# Patient Record
Sex: Female | Born: 1975 | Race: Black or African American | Hispanic: No | Marital: Single | Smoking: Former smoker
Health system: Southern US, Community
[De-identification: ages and names within clinical notes are randomized; demographics above are authoritative.]

## PROBLEM LIST (undated history)

## (undated) ENCOUNTER — Inpatient Hospital Stay (HOSPITAL_COMMUNITY): Payer: Self-pay

## (undated) DIAGNOSIS — I10 Essential (primary) hypertension: Secondary | ICD-10-CM

## (undated) DIAGNOSIS — J45909 Unspecified asthma, uncomplicated: Secondary | ICD-10-CM

## (undated) HISTORY — PX: HIP SURGERY: SHX245

---

## 2010-01-15 ENCOUNTER — Emergency Department (HOSPITAL_BASED_OUTPATIENT_CLINIC_OR_DEPARTMENT_OTHER): Admission: EM | Admit: 2010-01-15 | Discharge: 2010-01-15 | Payer: Self-pay | Admitting: Emergency Medicine

## 2010-01-15 ENCOUNTER — Ambulatory Visit: Payer: Self-pay | Admitting: Diagnostic Radiology

## 2010-10-14 LAB — WET PREP, GENITAL
Trich, Wet Prep: NONE SEEN
Yeast Wet Prep HPF POC: NONE SEEN

## 2010-10-14 LAB — DIFFERENTIAL
Basophils Absolute: 0.1 10*3/uL (ref 0.0–0.1)
Basophils Relative: 2 % — ABNORMAL HIGH (ref 0–1)
Eosinophils Absolute: 0 10*3/uL (ref 0.0–0.7)
Lymphocytes Relative: 34 % (ref 12–46)

## 2010-10-14 LAB — CBC
MCHC: 33 g/dL (ref 30.0–36.0)
MCV: 90.7 fL (ref 78.0–100.0)
RBC: 4.26 MIL/uL (ref 3.87–5.11)
WBC: 8.8 10*3/uL (ref 4.0–10.5)

## 2010-10-14 LAB — URINALYSIS, ROUTINE W REFLEX MICROSCOPIC
Bilirubin Urine: NEGATIVE
Glucose, UA: NEGATIVE mg/dL
Leukocytes, UA: NEGATIVE
Protein, ur: NEGATIVE mg/dL
Urobilinogen, UA: 0.2 mg/dL (ref 0.0–1.0)
pH: 6.5 (ref 5.0–8.0)

## 2010-10-14 LAB — PREGNANCY, URINE: Preg Test, Ur: POSITIVE

## 2010-10-14 LAB — GC/CHLAMYDIA PROBE AMP, GENITAL
Chlamydia, DNA Probe: NEGATIVE
GC Probe Amp, Genital: NEGATIVE

## 2010-10-14 LAB — HCG, QUANTITATIVE, PREGNANCY: hCG, Beta Chain, Quant, S: 59 m[IU]/mL — ABNORMAL HIGH (ref <5)

## 2012-01-29 IMAGING — US US OB TRANSVAGINAL
1 series · 14 of 28 positions shown · non-contrast
Comparison: None.

CLINICAL DATA: Pelvic pain and cramping.  Vaginal bleeding.  5-week-
3-day gestational age by LMP.

OBSTETRIC <14 WK US AND TRANSVAGINAL OB US
TECHNIQUE: Both transabdominal and transvaginal ultrasound
examinations were performed for complete evaluation of the
gestation as well as the maternal uterus, adnexal regions, and
pelvic cul-de-sac.  Transvaginal technique was performed to assess
early pregnancy.

[Series 1: us ob transvaginal · 0.30mm/px · 14 of 47 slices shown]
[im 2/47]
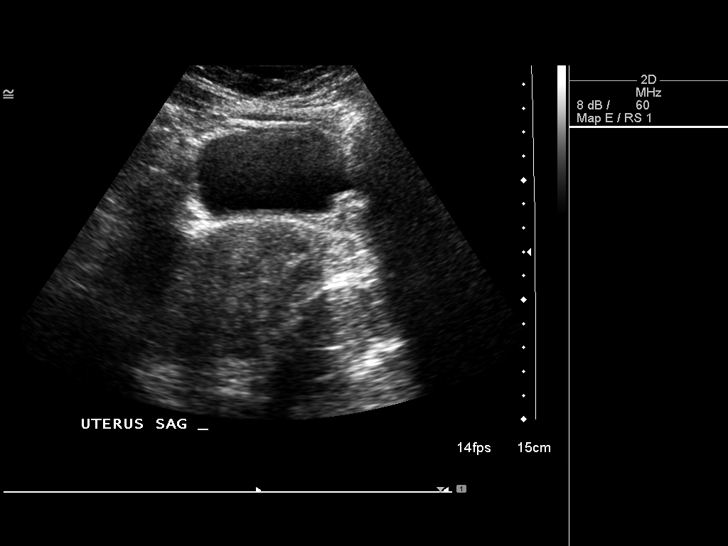
[im 6/47]
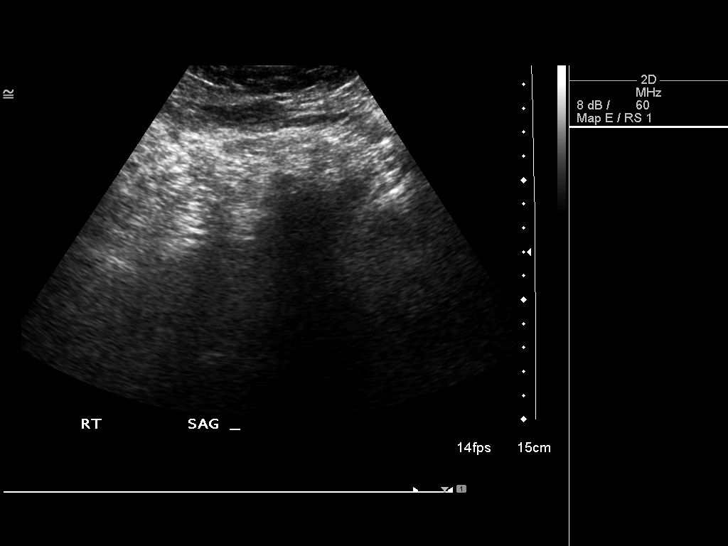
[im 9/47]
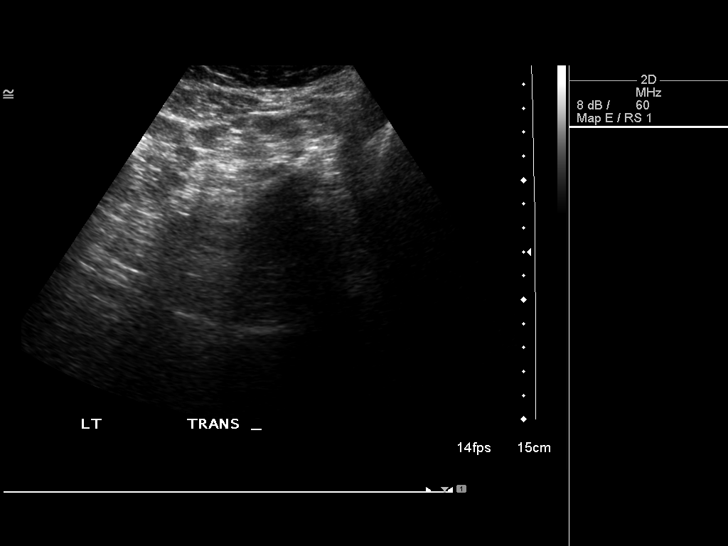
[im 12/47]
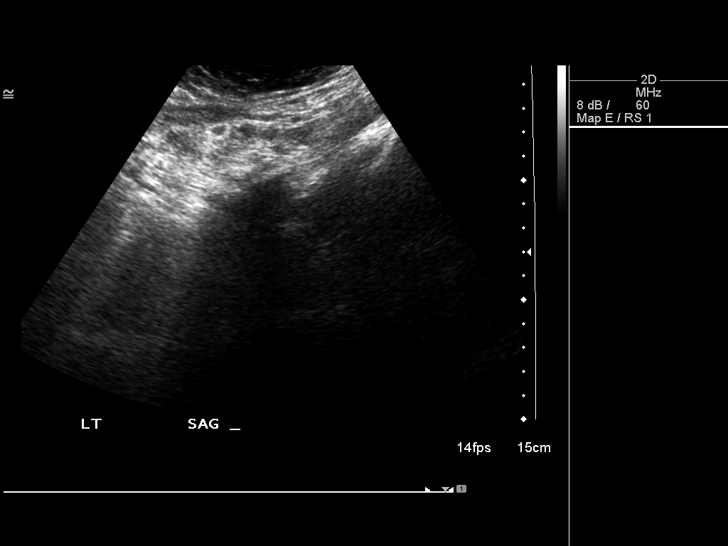
[im 16/47]
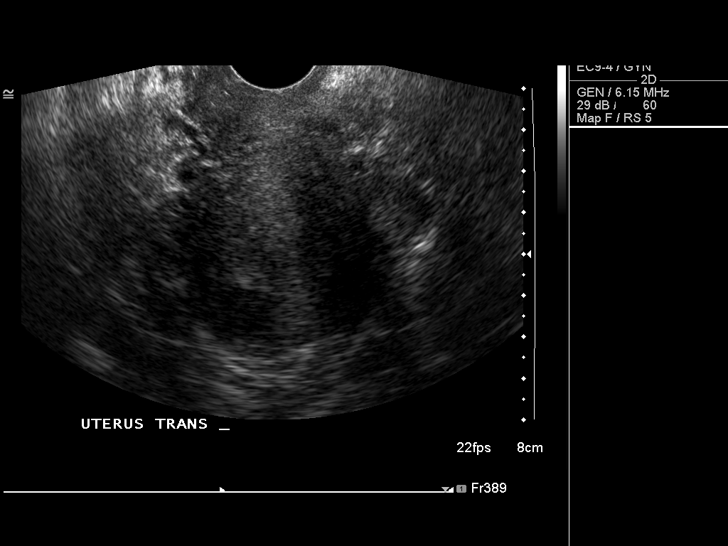
[im 19/47]
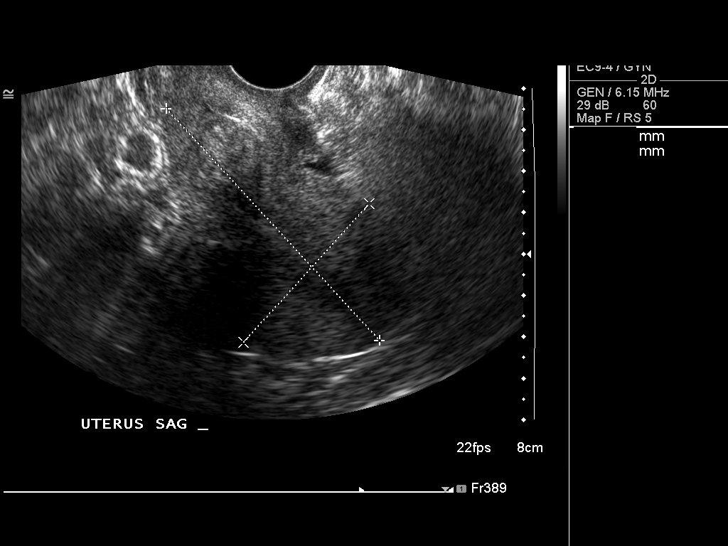
[im 23/47]
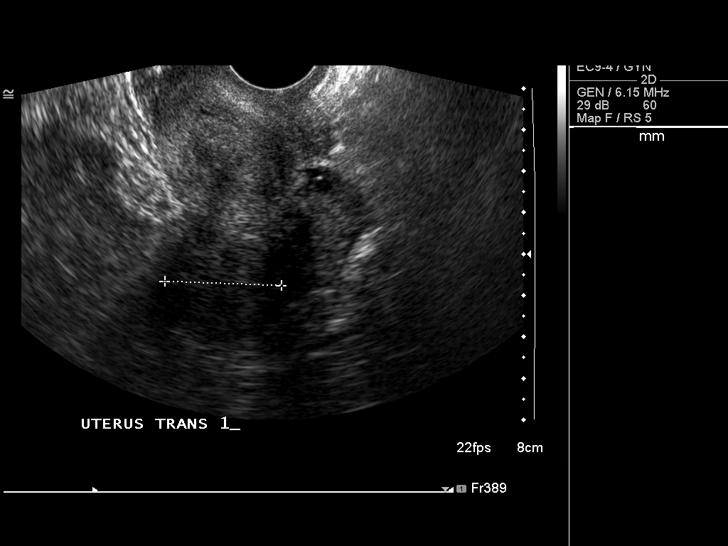
[im 26/47]
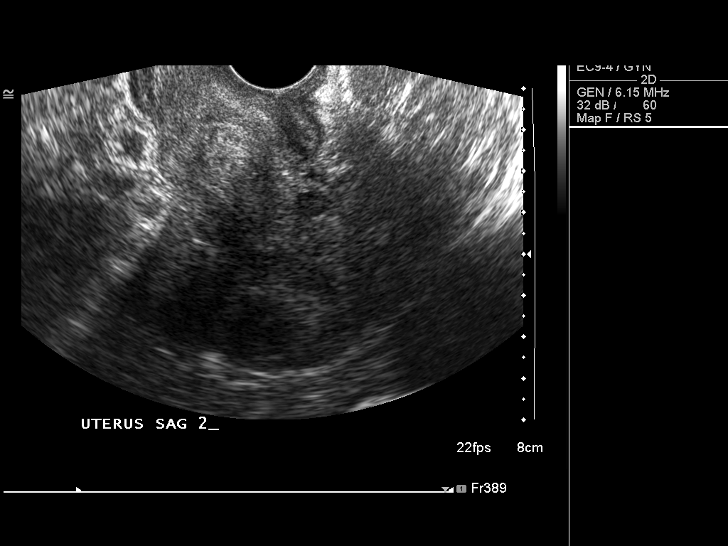
[im 29/47]
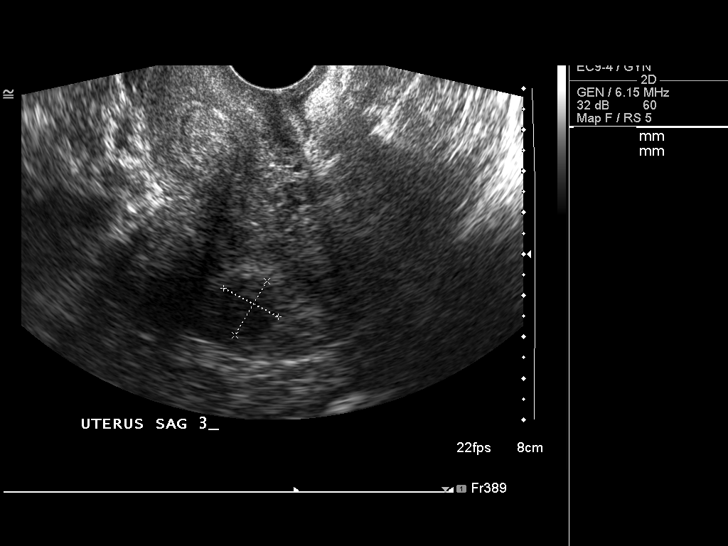
[im 33/47]
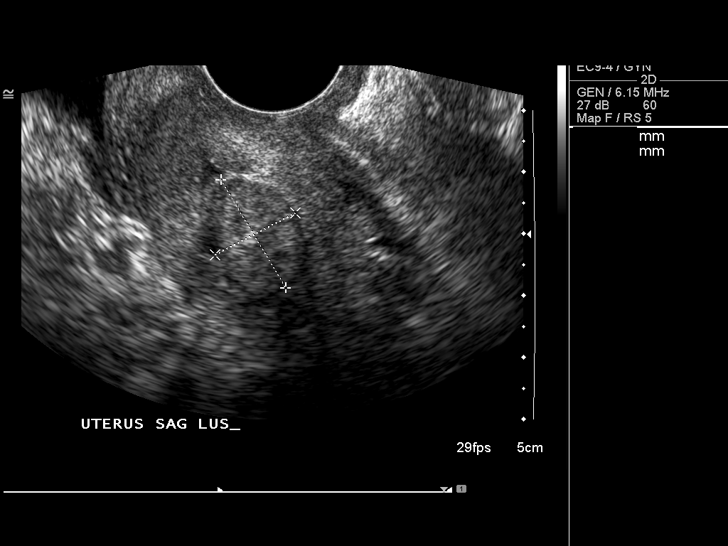
[im 36/47]
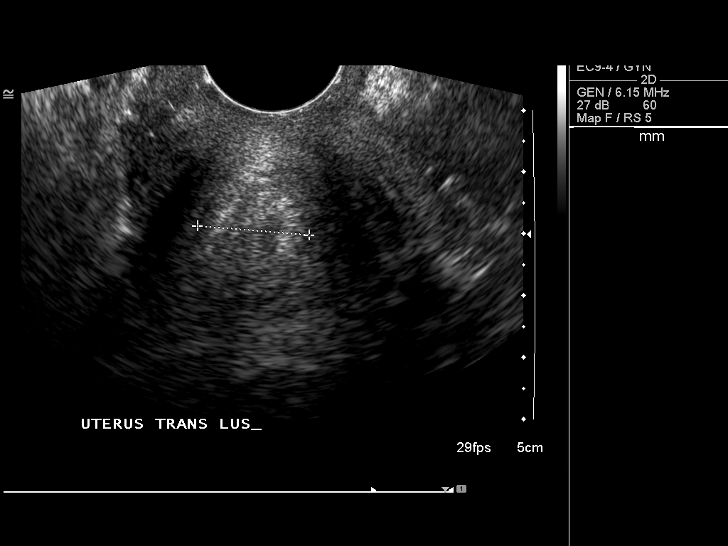
[im 40/47]
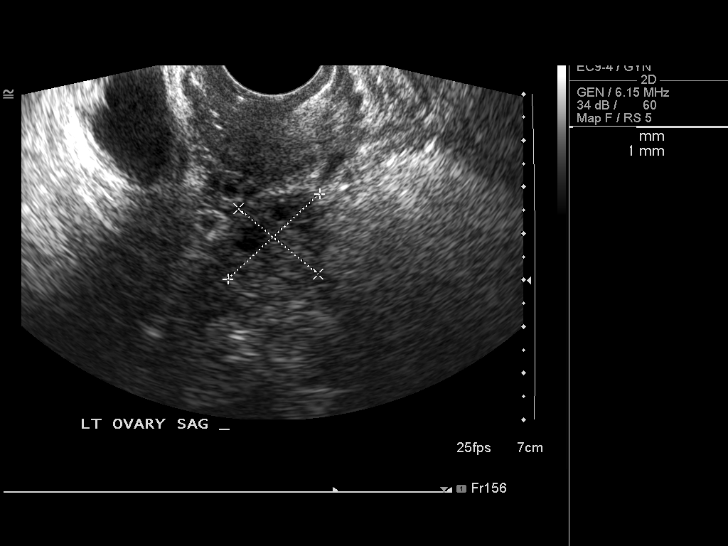
[im 43/47]
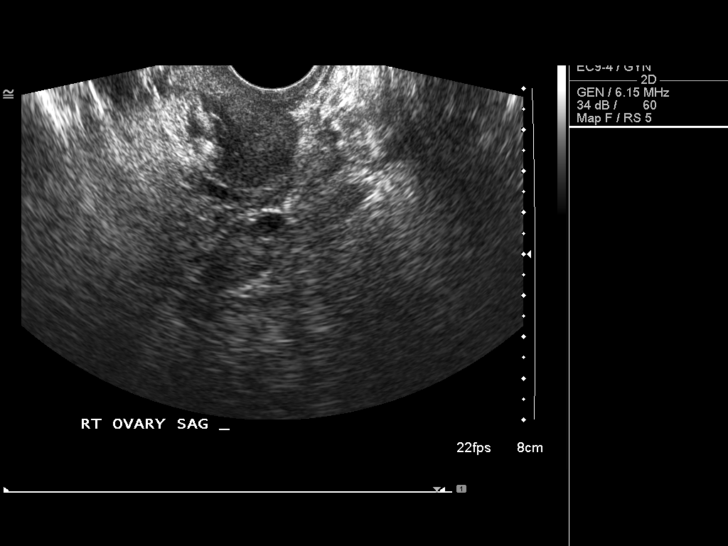
[im 47/47]
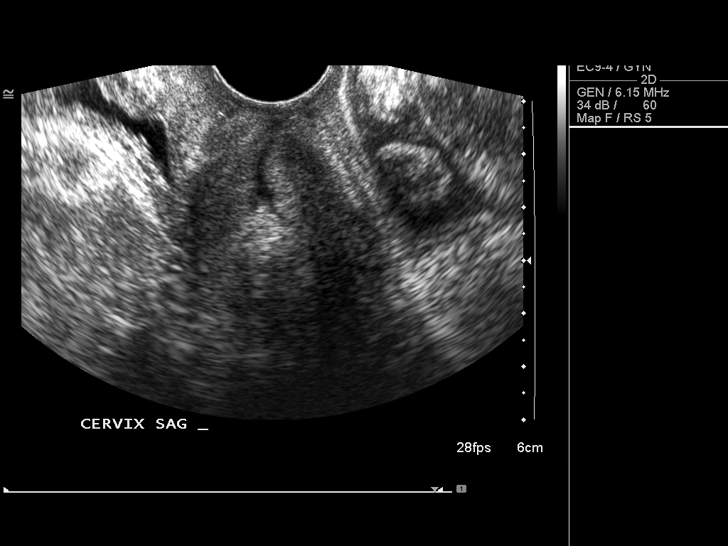

[14 of 28 positions shown; findings below may reference images not displayed]

FINDINGS: No intrauterine gestational sac or other fluid
collections are identified within the endometrial cavity.

Multiple uterine fibroids are seen which range in size from 1.1 cm
to 2.8 cm.

Both ovaries are normal in appearance.  There is no evidence of
adnexal mass or free fluid.
IMPRESSION: 1.  No evidence of intrauterine gestational sac or adnexal mass.
Differential diagnosis includes IUP too early to visualize, recent
spontaneous abortion, and occult ectopic pregnancy.  Close follow
up of quantitative beta HCG levels is recommended, with follow-up
by ultrasound if clinically warranted.
2.  Multiple uterine fibroids measuring up to 2.8 cm.

## 2013-02-11 ENCOUNTER — Inpatient Hospital Stay (HOSPITAL_COMMUNITY)
Admission: AD | Admit: 2013-02-11 | Discharge: 2013-02-11 | Disposition: A | Discharge status: Home / Self Care | Payer: BC Managed Care – PPO | Source: Ambulatory Visit | Attending: Obstetrics & Gynecology | Admitting: Obstetrics & Gynecology

## 2013-02-11 ENCOUNTER — Inpatient Hospital Stay (HOSPITAL_COMMUNITY): Payer: BC Managed Care – PPO

## 2013-02-11 ENCOUNTER — Encounter (HOSPITAL_COMMUNITY): Payer: Self-pay | Admitting: *Deleted

## 2013-02-11 DIAGNOSIS — N730 Acute parametritis and pelvic cellulitis: Secondary | ICD-10-CM | POA: Insufficient documentation

## 2013-02-11 DIAGNOSIS — O030 Genital tract and pelvic infection following incomplete spontaneous abortion: Secondary | ICD-10-CM | POA: Insufficient documentation

## 2013-02-11 DIAGNOSIS — O034 Incomplete spontaneous abortion without complication: Secondary | ICD-10-CM

## 2013-02-11 HISTORY — DX: Unspecified asthma, uncomplicated: J45.909

## 2013-02-11 HISTORY — DX: Essential (primary) hypertension: I10

## 2013-02-11 LAB — CBC
HCT: 32.8 % — ABNORMAL LOW (ref 36.0–46.0)
Hemoglobin: 10.7 g/dL — ABNORMAL LOW (ref 12.0–15.0)
MCH: 30 pg (ref 26.0–34.0)
MCHC: 32.6 g/dL (ref 30.0–36.0)
MCV: 91.9 fL (ref 78.0–100.0)
Platelets: 351 10*3/uL (ref 150–400)
RBC: 3.57 MIL/uL — ABNORMAL LOW (ref 3.87–5.11)
RDW: 14.4 % (ref 11.5–15.5)
WBC: 7.8 10*3/uL (ref 4.0–10.5)

## 2013-02-11 LAB — HCG, QUANTITATIVE, PREGNANCY: hCG, Beta Chain, Quant, S: 839 m[IU]/mL — ABNORMAL HIGH

## 2013-02-11 MED ORDER — MISOPROSTOL 200 MCG PO TABS: ORAL_TABLET | ORAL | Status: DC

## 2013-02-11 MED ORDER — HYDROCODONE-ACETAMINOPHEN 5-325 MG PO TABS: ORAL_TABLET | ORAL | Status: DC

## 2013-02-11 MED ORDER — IBUPROFEN 600 MG PO TABS: 600.0000 mg | ORAL_TABLET | Freq: Four times a day (QID) | ORAL | Status: AC | PRN

## 2013-02-11 MED ORDER — DOXYCYCLINE HYCLATE 100 MG PO CAPS: 100.0000 mg | ORAL_CAPSULE | Freq: Two times a day (BID) | ORAL | Status: DC

## 2013-02-11 NOTE — MAU Note (Signed)
Pt was told she was having a miscarriage last month . Still having some bleeding and cramping . Seen at [facility] medical center and told she had some retained POC and should have a D&C.

## 2013-02-11 NOTE — MAU Note (Addendum)
PT SAYS SHE  WENT TO  DR DORN IN [place]-  ON 6-24-  TOLD NO FHR--  SPOTTING.   ON  6-27-  HE CALLED HER  WITH HCG LEVELS  AND WAS TO [facility] AN U/S.   THEN  ON 6-30 -  HAD PHONE CALL WITH OFFICE- TOLD  DID NOT  KNOW WHY SHE  WAS TOLD TO CALL- BECAUSE NO OPENINGS- AND THE NURSE WOULD CALL HER BACK  BUT  NOONE CALL ED BACK.     THEN ON 7-2-   PASSED PREG- AT HOME -  DID NOT GO BACK TO DR.   WAS BLEEDING UNTIL 7-7.  HAD OFF/ON CRAMPING  AND HAS CONTINUED.    SAYS NOW SHE CRAMPING   IS WORSE-    TOOK TYLENOL AT  5 PM- AT WORK  WITH NO RELIEF.    LAST SEX-  5-25.     WENT TO [facility] TODAY    BECAUSE OF CRAMPING.   THEY DID VAG U/S.   SHE STARTED SPOTTING ON  7-14/15/16  THEN TODAY START  FLOWING.
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
## 2013-02-15 NOTE — MAU Provider Note (Signed)
Pt seen and examined.  Pt offered D&E and cytotec.  Pt opted for cytotec.  Pt will follow up in the clinic.  Locklan Canoy H.

## 2013-12-17 ENCOUNTER — Encounter (HOSPITAL_COMMUNITY): Payer: Self-pay | Admitting: *Deleted

## 2014-05-30 ENCOUNTER — Encounter (HOSPITAL_COMMUNITY): Payer: Self-pay | Admitting: *Deleted

## 2015-02-25 IMAGING — US US OB TRANSVAGINAL
3 series · 13 of 28 positions shown · non-contrast
Comparison: none

[Series 1: us pelvis complete · 44 acquisitions, 8 frames shown (1 of 3)]
[im 3/44]
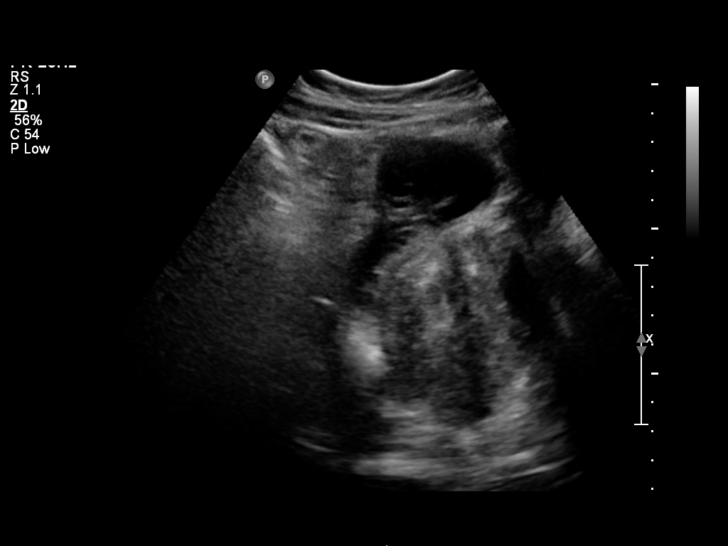
[im 8/44]
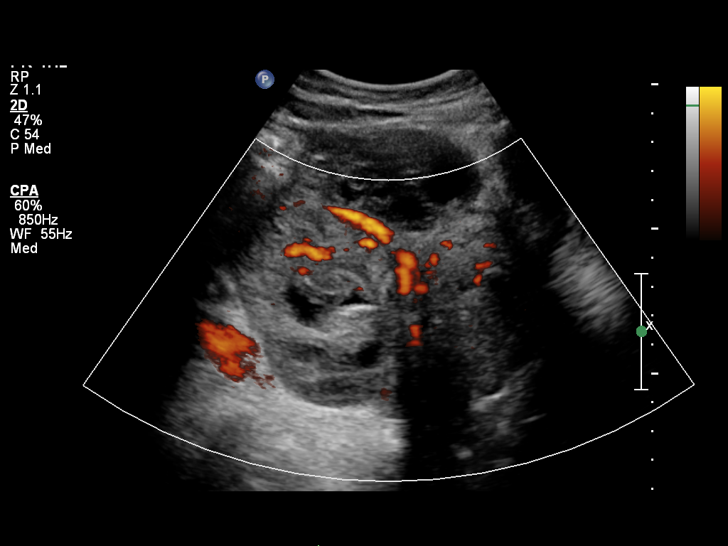
[im 13/44]
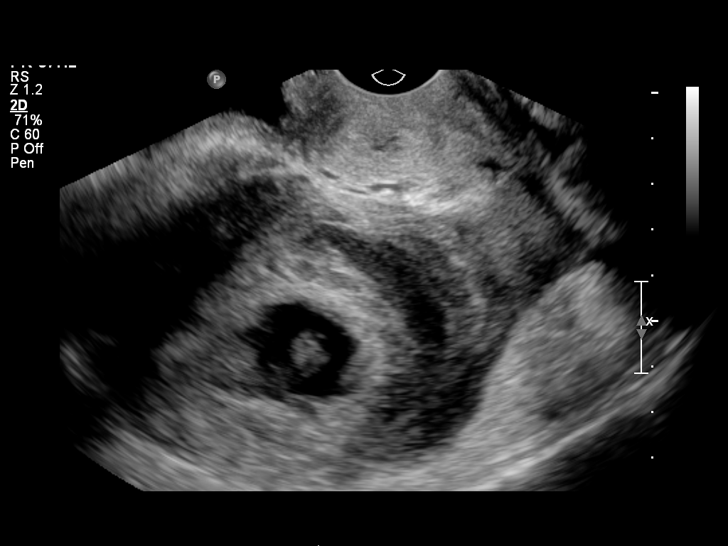
[im 18/44]
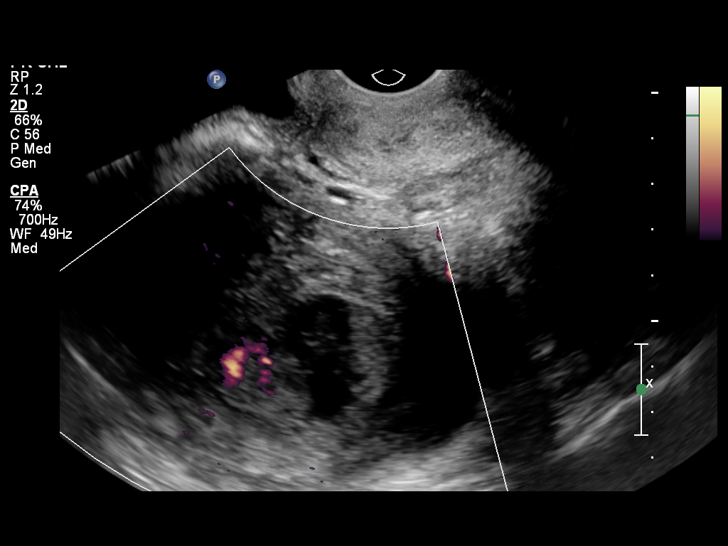
[im 23/44]
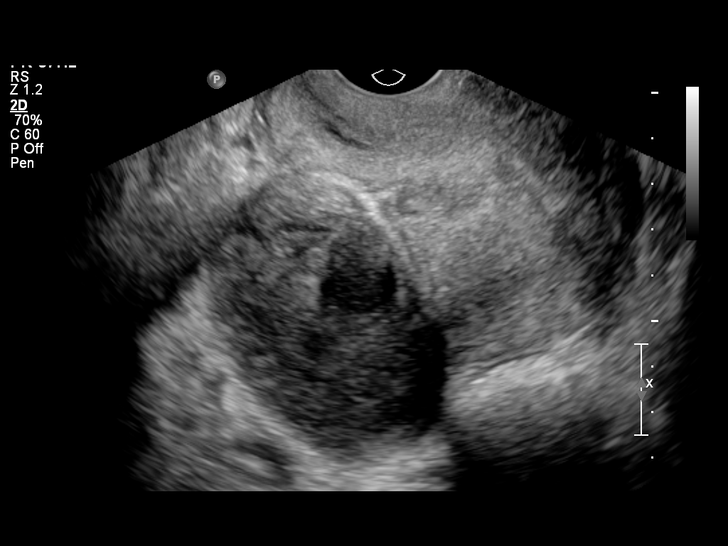
[im 28/44]
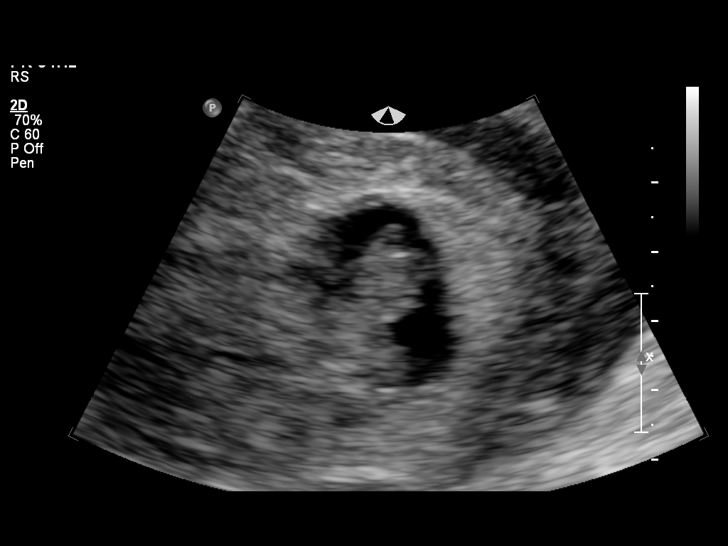
[im 36/44]
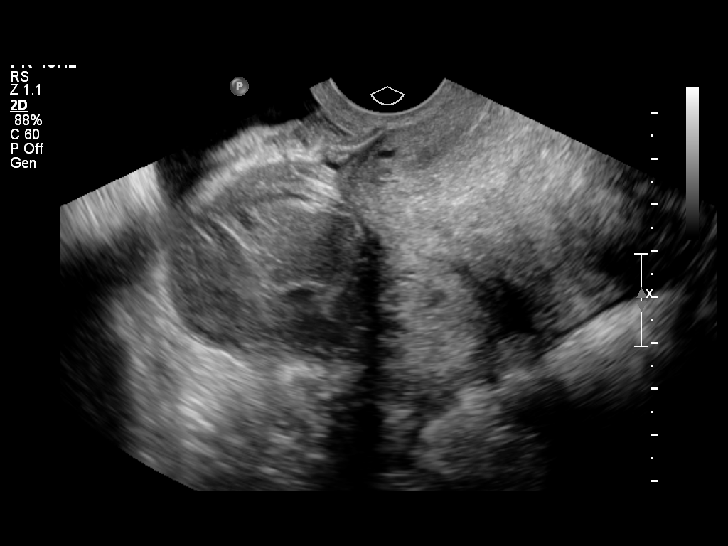
[im 41/44]
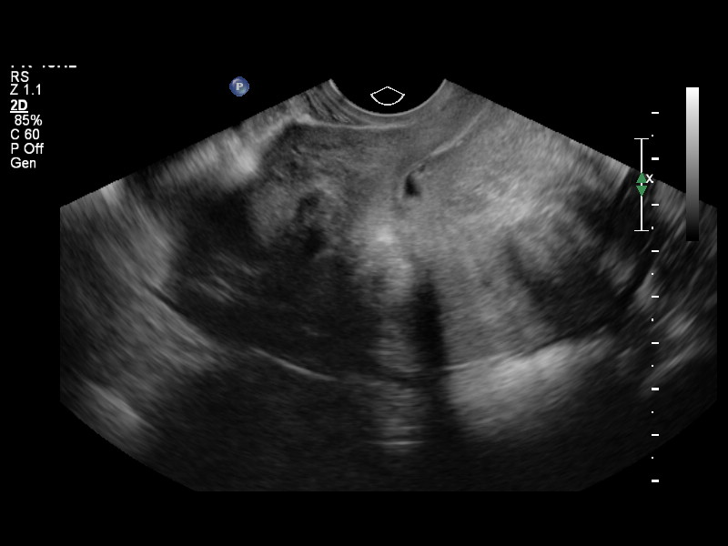

[Series 1: us pelvis complete · 4 acquisitions, 1 frame shown (2 of 3)]
[im 1/4]
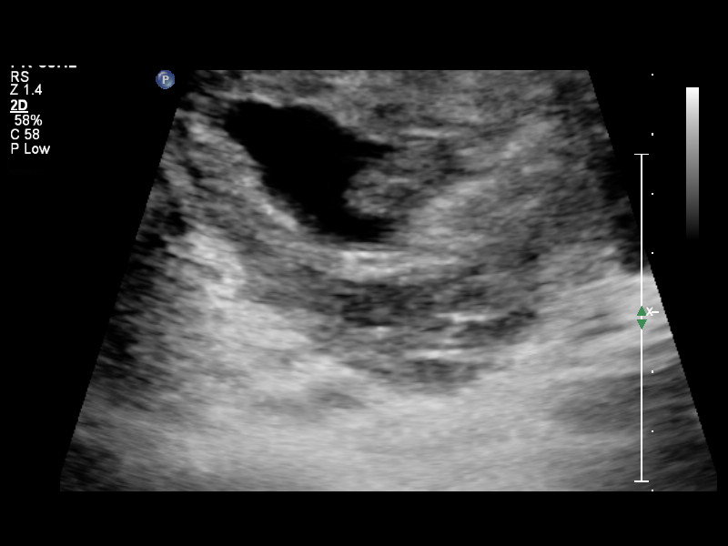

[Series 1: us pelvis complete · 19 acquisitions, 4 frames shown (3 of 3)]
[im 1/19]
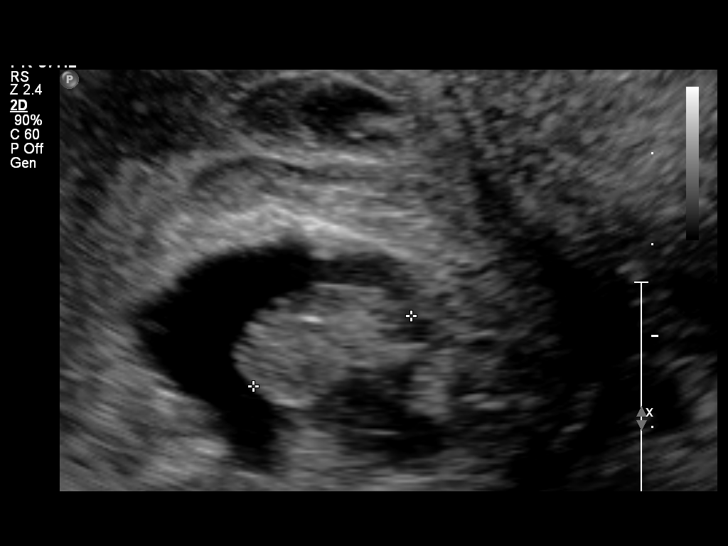
[im 6/19]
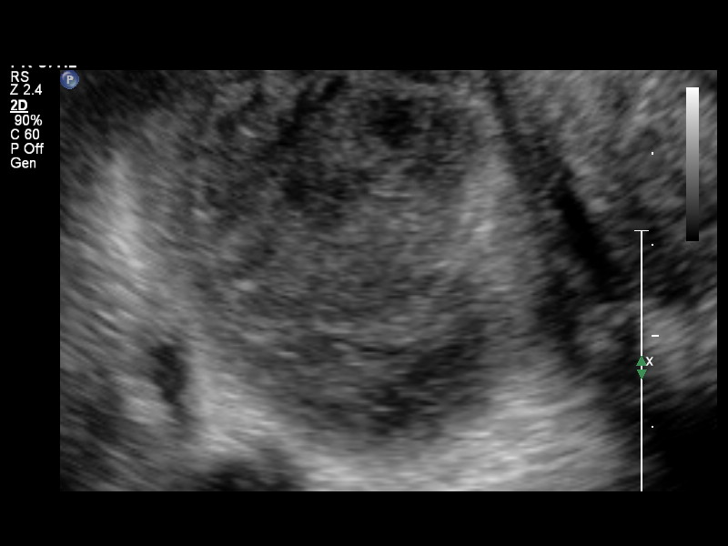
[im 11/19]
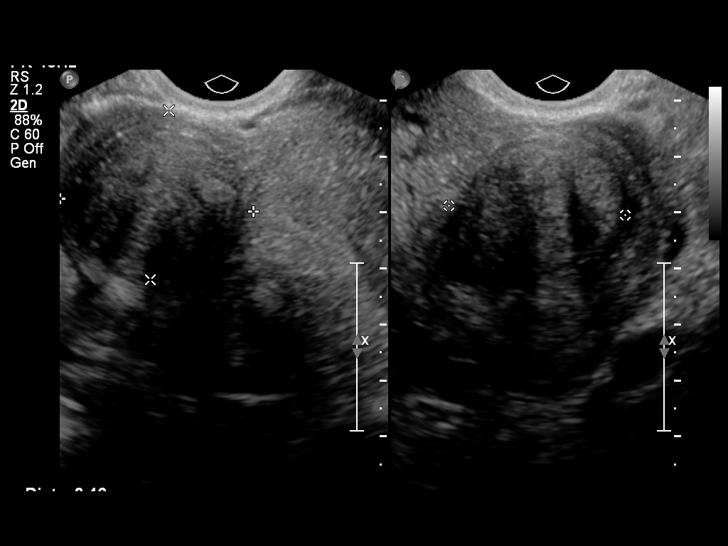
[im 16/19]
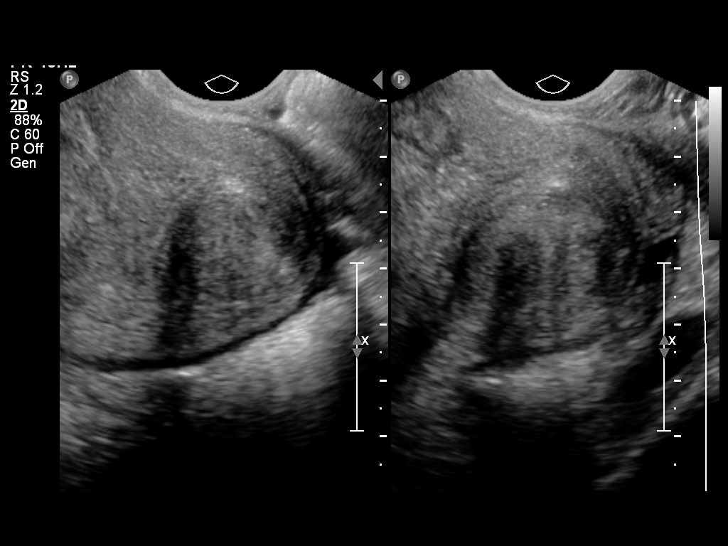

[13 of 28 positions shown; findings below may reference images not displayed]

OBSTETRICS REPORT
                      (Signed Final 02/11/2013 [DATE])

Service(s) Provided

 US OB COMP LESS 14 WKS                                76801.0
 US OB TRANSVAGINAL                                    76817.0
Indications

 Pain - Abdominal/Pelvic
 Poor obstetric history-Recurrent (habitual) abortion
 (3 consecutive ab's)
 Vaginal bleeding, unknown etiology
Fetal Evaluation

 Num Of Fetuses:    1
 Preg. Location:    Intrauterine
 Gest. Sac:         Intrauterine
 Yolk Sac:          Visualized
 Fetal Pole:        Visualized
 Cardiac Activity:  Absent
Biometry

 CRL:     19.2  mm    G. Age:   8w 2d                  EDD:   09/21/13
Cervix Uterus Adnexa

 Cervix:       Closed
 Uterus:       Multiple fibroids noted, see table below.
 Cul De Sac:   No free fluid seen.
 Left Ovary:   Not visualized.
 Right Ovary:  Not visualized.

 Adnexa:     Bilateral hydrosalpinx and pyrosalpinx visulaized
 Comment:    Right hydrosalpinx 6.6 x 3.6 x 3.5 cm, left hydrosalpinx
             with internal echoes 6.0 x 4.5 x 4.5 cm
Myomas

 Site                     L(cm)      W(cm)      D(cm)       Location
 LUS Right                3.5        3          3.2         Intramural
 LUS                      2.5        1.8        2.2         Intramural
 LUS Left                 3.6        3          2.7         Intramural
 Blood Flow                  RI       PI       Comments
 Blood Flow                  RI       PI       Comments

Impression

 Intrauterine fetal demise at 8w2d. Intrauterine gestational
 sac, yolk sac, and fetal pole, but no cardiac activity noted.

 Bilateral hydrosalpinges. Given the presence of internal
 echoes within the left adnexal tubular structure, likely
 fallopian tube, this could represent PID. However, heterotopic
 pregnancy is also possible (but considered much less
 statistically likely).

 Uterine fibroids.

 Findings discussed with Karen Constance by Dr. Osoianu 913pm
 02/11/13.

## 2015-12-03 ENCOUNTER — Encounter (HOSPITAL_BASED_OUTPATIENT_CLINIC_OR_DEPARTMENT_OTHER): Payer: Self-pay | Admitting: *Deleted

## 2015-12-03 ENCOUNTER — Emergency Department (HOSPITAL_BASED_OUTPATIENT_CLINIC_OR_DEPARTMENT_OTHER)
Admission: EM | Admit: 2015-12-03 | Discharge: 2015-12-03 | Disposition: A | Discharge status: Home / Self Care | Payer: BLUE CROSS/BLUE SHIELD | Attending: Emergency Medicine | Admitting: Emergency Medicine

## 2015-12-03 DIAGNOSIS — M25512 Pain in left shoulder: Secondary | ICD-10-CM | POA: Diagnosis not present

## 2015-12-03 DIAGNOSIS — I10 Essential (primary) hypertension: Secondary | ICD-10-CM | POA: Insufficient documentation

## 2015-12-03 DIAGNOSIS — J45909 Unspecified asthma, uncomplicated: Secondary | ICD-10-CM | POA: Diagnosis not present

## 2015-12-03 DIAGNOSIS — M25552 Pain in left hip: Secondary | ICD-10-CM | POA: Diagnosis not present

## 2015-12-03 DIAGNOSIS — Z87891 Personal history of nicotine dependence: Secondary | ICD-10-CM | POA: Diagnosis not present

## 2015-12-03 MED ORDER — IBUPROFEN 800 MG PO TABS
800.0000 mg | ORAL_TABLET | Freq: Once | ORAL | Status: AC
  Administered 2015-12-03: 800 mg via ORAL
  Filled 2015-12-03: qty 1

## 2015-12-03 MED ORDER — IBUPROFEN 800 MG PO TABS: 800.0000 mg | ORAL_TABLET | Freq: Three times a day (TID) | ORAL | Status: AC

## 2015-12-03 MED ORDER — METHOCARBAMOL 500 MG PO TABS: 500.0000 mg | ORAL_TABLET | Freq: Two times a day (BID) | ORAL | Status: AC
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
## 2015-12-03 NOTE — Discharge Instructions (Signed)
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
## 2015-12-03 NOTE — ED Notes (Signed)
Pt given d/c instructions as per chart. Verbalizes understanding. No questions. Rx x 1
